# Patient Record
Sex: Male | Born: 1995 | Race: White | Hispanic: No | Marital: Single | Smoking: Current every day smoker
Health system: Southern US, Community
[De-identification: ages and names within clinical notes are randomized; demographics above are authoritative.]

---
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
## 2016-06-01 ENCOUNTER — Encounter: Payer: Self-pay | Admitting: Emergency Medicine

## 2016-06-01 ENCOUNTER — Emergency Department: Payer: Worker's Compensation

## 2016-06-01 ENCOUNTER — Emergency Department
Admission: EM | Admit: 2016-06-01 | Discharge: 2016-06-01 | Disposition: A | Discharge status: Home / Self Care | Payer: Worker's Compensation | Attending: Emergency Medicine | Admitting: Emergency Medicine

## 2016-06-01 DIAGNOSIS — Y92410 Unspecified street and highway as the place of occurrence of the external cause: Secondary | ICD-10-CM | POA: Diagnosis not present

## 2016-06-01 DIAGNOSIS — F17200 Nicotine dependence, unspecified, uncomplicated: Secondary | ICD-10-CM | POA: Diagnosis not present

## 2016-06-01 DIAGNOSIS — Y999 Unspecified external cause status: Secondary | ICD-10-CM | POA: Diagnosis not present

## 2016-06-01 DIAGNOSIS — M545 Low back pain: Secondary | ICD-10-CM | POA: Insufficient documentation

## 2016-06-01 DIAGNOSIS — S3992XA Unspecified injury of lower back, initial encounter: Secondary | ICD-10-CM | POA: Diagnosis present

## 2016-06-01 DIAGNOSIS — Y939 Activity, unspecified: Secondary | ICD-10-CM | POA: Diagnosis not present

## 2016-06-01 MED ORDER — CYCLOBENZAPRINE HCL 5 MG PO TABS: 5.0000 mg | ORAL_TABLET | Freq: Three times a day (TID) | ORAL | 0 refills | Status: AC | PRN

## 2016-06-01 MED ORDER — NAPROXEN 500 MG PO TABS: 500.0000 mg | ORAL_TABLET | Freq: Two times a day (BID) | ORAL | 0 refills | Status: AC
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
## 2020-05-09 ENCOUNTER — Other Ambulatory Visit: Payer: Self-pay | Admitting: Specialist

## 2020-05-09 DIAGNOSIS — M25512 Pain in left shoulder: Secondary | ICD-10-CM

## 2020-05-24 ENCOUNTER — Other Ambulatory Visit: Payer: Self-pay

## 2020-05-24 ENCOUNTER — Ambulatory Visit
Admission: RE | Admit: 2020-05-24 | Discharge: 2020-05-24 | Disposition: A | Discharge status: Home / Self Care | Payer: No Typology Code available for payment source | Source: Ambulatory Visit | Attending: Specialist | Admitting: Specialist

## 2020-05-24 DIAGNOSIS — M25512 Pain in left shoulder: Secondary | ICD-10-CM | POA: Insufficient documentation

## 2020-05-24 MED ORDER — SODIUM CHLORIDE (PF) 0.9 % IJ SOLN
10.0000 mL | INTRAMUSCULAR | Status: DC | PRN
  Administered 2020-05-24: 10 mL

## 2020-05-24 MED ORDER — IOHEXOL 180 MG/ML  SOLN
20.0000 mL | Freq: Once | INTRAMUSCULAR | Status: AC | PRN
  Administered 2020-05-24: 20 mL

## 2020-05-24 MED ORDER — LIDOCAINE HCL (PF) 1 % IJ SOLN
5.0000 mL | Freq: Once | INTRAMUSCULAR | Status: AC
  Administered 2020-05-24: 5 mL via INTRADERMAL
  Filled 2020-05-24: qty 5

## 2020-05-24 MED ORDER — GADOBUTROL 1 MMOL/ML IV SOLN
2.0000 mL | Freq: Once | INTRAVENOUS | Status: AC | PRN
  Administered 2020-05-24: 2 mL

## 2021-11-10 IMAGING — RF DG FLUORO GUIDE NDL PLC/BX
2 series · 5 of 5 positions shown · IV contrast (omnipaque)
Comparison: none

CLINICAL DATA: Left shoulder pain.

EXAM:
LEFT SHOULDER INJECTION UNDER FLUOROSCOPY
TECHNIQUE: After discussing the risks and benefits of this procedure the
patient informed consent was obtained. Following local anesthesia
with 1% lidocaine a 22 gauge needle was advanced into the left
shoulder joint and a standardized mixture of Omnipaque 180, sterile
saline, and gadolinium administered under fluoroscopic guidance. No
complications. Post arthrography instructions given to the patient.
FLUOROSCOPY TIME:  Fluoroscopy Time:  1 minutes 48 seconds
Radiation Exposure Index (if provided by the fluoroscopic device):
33.3 mGy

[Series 1: cp_standard · 0.17mm/px · 1 of 1 slices shown (1 of 2)]
[im 1/1]
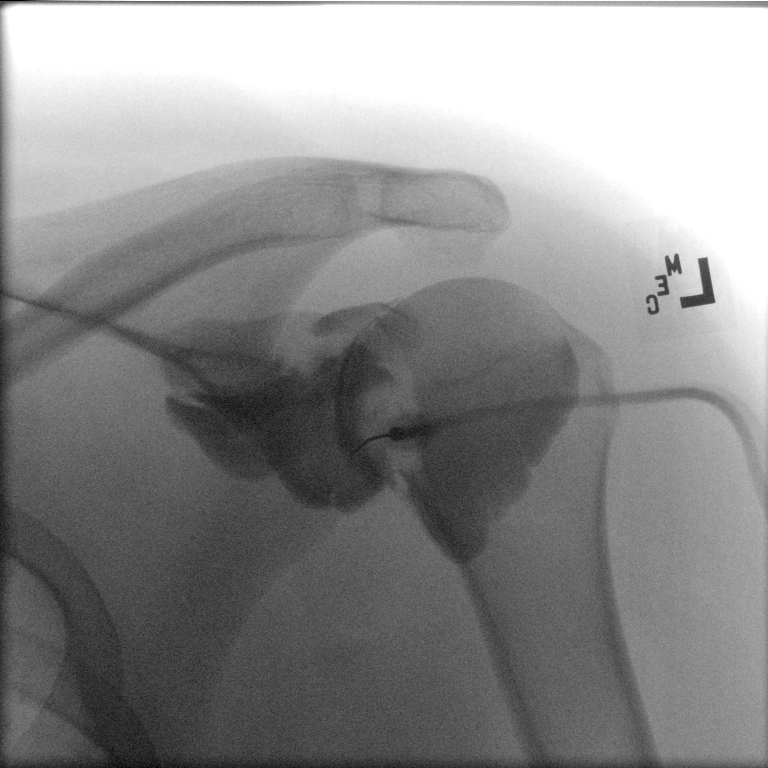

[Series 2: cp_standard · 0.17mm/px · 4 of 17 frames shown (2 of 2)]
[frame 1/17]
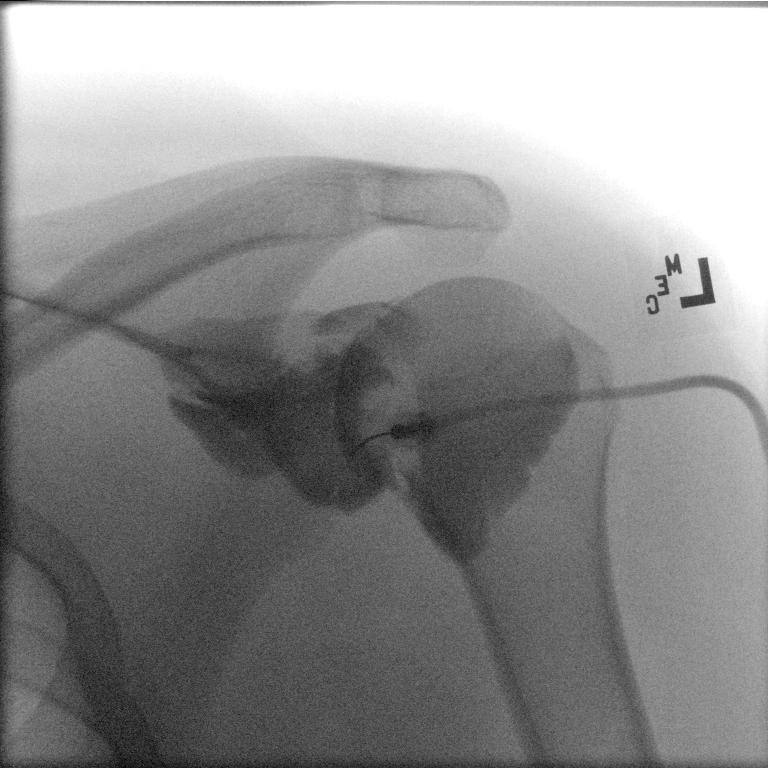
[frame 3/17]
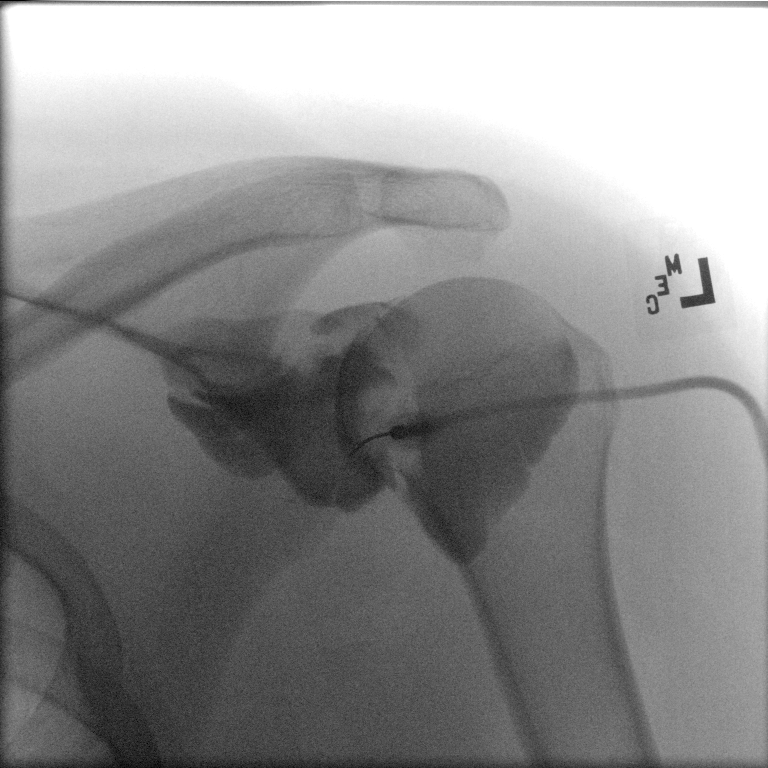
[frame 9/17]
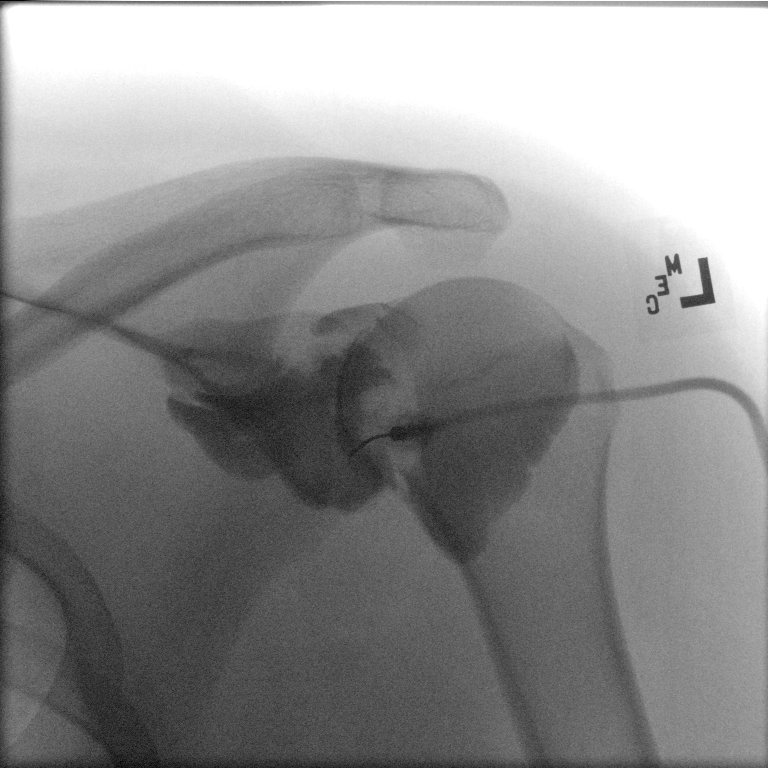
[frame 15/17]
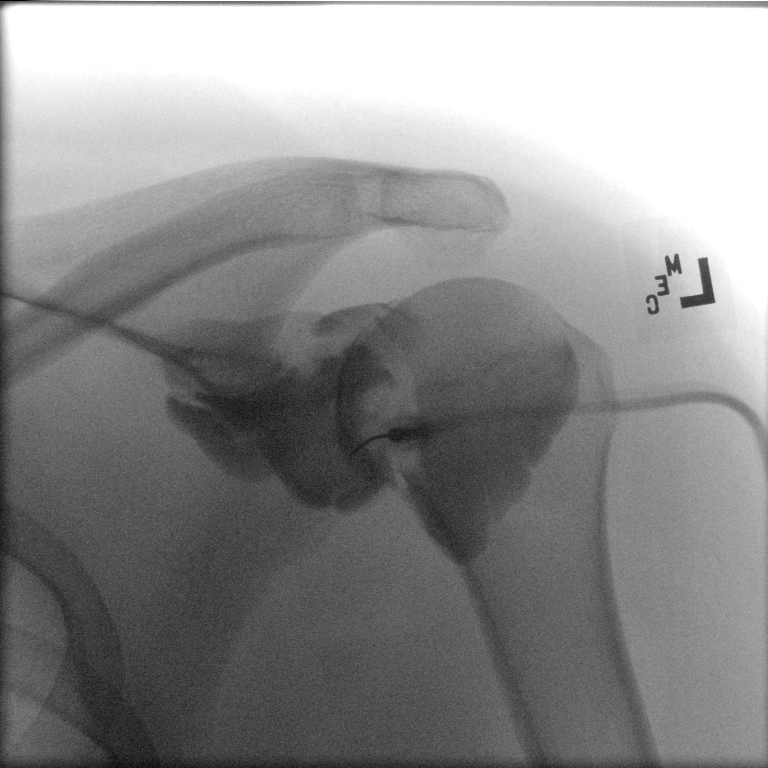

[5 of 5 positions shown; findings below may reference images not displayed]

FINDINGS: Successful left shoulder injection for MRI arthrography. No
complications.
IMPRESSION: Technically successful left shoulder injection for MRI.

## 2021-11-22 ENCOUNTER — Emergency Department (HOSPITAL_COMMUNITY): Payer: Managed Care, Other (non HMO)

## 2021-11-22 ENCOUNTER — Emergency Department (HOSPITAL_COMMUNITY)
Admission: EM | Admit: 2021-11-22 | Discharge: 2021-11-23 | Disposition: A | Discharge status: Home / Self Care | Payer: Managed Care, Other (non HMO) | Attending: Emergency Medicine | Admitting: Emergency Medicine

## 2021-11-22 ENCOUNTER — Other Ambulatory Visit: Payer: Self-pay

## 2021-11-22 DIAGNOSIS — N201 Calculus of ureter: Secondary | ICD-10-CM | POA: Diagnosis not present

## 2021-11-22 DIAGNOSIS — N23 Unspecified renal colic: Secondary | ICD-10-CM

## 2021-11-22 DIAGNOSIS — R1031 Right lower quadrant pain: Secondary | ICD-10-CM | POA: Diagnosis present

## 2021-11-22 LAB — CBC WITH DIFFERENTIAL/PLATELET
Abs Immature Granulocytes: 0.13 10*3/uL — ABNORMAL HIGH (ref 0.00–0.07)
Basophils Absolute: 0.1 10*3/uL (ref 0.0–0.1)
Basophils Relative: 1 %
Eosinophils Absolute: 0.2 10*3/uL (ref 0.0–0.5)
Eosinophils Relative: 1 %
HCT: 50.4 % (ref 39.0–52.0)
Hemoglobin: 18.2 g/dL — ABNORMAL HIGH (ref 13.0–17.0)
Immature Granulocytes: 1 %
Lymphocytes Relative: 26 %
Lymphs Abs: 5.3 10*3/uL — ABNORMAL HIGH (ref 0.7–4.0)
MCH: 29.6 pg (ref 26.0–34.0)
MCHC: 36.1 g/dL — ABNORMAL HIGH (ref 30.0–36.0)
MCV: 82.1 fL (ref 80.0–100.0)
Monocytes Absolute: 1.2 10*3/uL — ABNORMAL HIGH (ref 0.1–1.0)
Monocytes Relative: 6 %
Neutro Abs: 13.1 10*3/uL — ABNORMAL HIGH (ref 1.7–7.7)
Neutrophils Relative %: 65 %
Platelets: 345 10*3/uL (ref 150–400)
RBC: 6.14 MIL/uL — ABNORMAL HIGH (ref 4.22–5.81)
RDW: 12.1 % (ref 11.5–15.5)
WBC: 20 10*3/uL — ABNORMAL HIGH (ref 4.0–10.5)
nRBC: 0 % (ref 0.0–0.2)

## 2021-11-22 LAB — COMPREHENSIVE METABOLIC PANEL
ALT: 32 U/L (ref 0–44)
AST: 26 U/L (ref 15–41)
Albumin: 4.7 g/dL (ref 3.5–5.0)
Alkaline Phosphatase: 83 U/L (ref 38–126)
Anion gap: 18 — ABNORMAL HIGH (ref 5–15)
BUN: 14 mg/dL (ref 6–20)
CO2: 18 mmol/L — ABNORMAL LOW (ref 22–32)
Calcium: 10.2 mg/dL (ref 8.9–10.3)
Chloride: 102 mmol/L (ref 98–111)
Creatinine, Ser: 1.14 mg/dL (ref 0.61–1.24)
GFR, Estimated: >60 mL/min (ref >60)
Glucose, Bld: 131 mg/dL — ABNORMAL HIGH (ref 70–99)
Potassium: 3.6 mmol/L (ref 3.5–5.1)
Sodium: 138 mmol/L (ref 135–145)
Total Bilirubin: 0.8 mg/dL (ref 0.3–1.2)
Total Protein: 7.6 g/dL (ref 6.5–8.1)

## 2021-11-22 MED ORDER — KETOROLAC TROMETHAMINE 15 MG/ML IJ SOLN
15.0000 mg | Freq: Once | INTRAMUSCULAR | Status: AC
  Administered 2021-11-23: 15 mg via INTRAVENOUS
  Filled 2021-11-22: qty 1

## 2021-11-22 MED ORDER — FENTANYL CITRATE PF 50 MCG/ML IJ SOSY
75.0000 ug | PREFILLED_SYRINGE | Freq: Once | INTRAMUSCULAR | Status: AC
  Administered 2021-11-22: 75 ug via INTRAVENOUS
  Filled 2021-11-22: qty 2

## 2021-11-22 MED ORDER — HYDROMORPHONE HCL 1 MG/ML IJ SOLN
1.0000 mg | Freq: Once | INTRAMUSCULAR | Status: AC
  Administered 2021-11-23: 1 mg via INTRAVENOUS
  Filled 2021-11-22: qty 1

## 2021-11-22 MED ORDER — ONDANSETRON HCL 4 MG/2ML IJ SOLN
4.0000 mg | Freq: Once | INTRAMUSCULAR | Status: AC
  Administered 2021-11-22: 4 mg via INTRAVENOUS
  Filled 2021-11-22: qty 2

## 2021-11-22 MED ORDER — SODIUM CHLORIDE 0.9 % IV BOLUS
1000.0000 mL | Freq: Once | INTRAVENOUS | Status: AC
  Administered 2021-11-23: 1000 mL via INTRAVENOUS

## 2021-11-22 NOTE — ED Triage Notes (Addendum)
Severe R testicular pain that radiates into R flank, feels like cramping. Denies symptoms with urination. Endorses N/V. Denies fever.  No h/o abd surgeries. Last PO intake at 7pm.  No PMH or daily meds.

## 2021-11-22 NOTE — ED Provider Triage Note (Signed)
Emergency Medicine Provider Triage Evaluation Note  Ronald Hodge , a 26 y.o. male  was evaluated in triage.  Pt complains of sudden onset right testicular pain that radiates into his lower abdomen that started around 7:45 PM this evening.  N.p.o. since 7 PM.  Pain is 10 out of 10, associated nausea and vomiting with NBNB emesis.  No history of abdominal surgeries or similar pain in the past.  No urinary symptoms.  Review of Systems  Positive: As above Negative: Diarrhea fevers chills urinary symptoms  Physical Exam  BP (!) 157/67 (BP Location: Right Arm)   Pulse 80   Temp 98 F (36.7 C) (Oral)   Resp 16   Ht 6' (1.829 m)   Wt 129.3 kg   SpO2 100%   BMI 38.65 kg/m  Gen:   Awake, diaphoretic, visibly uncomfortable, tremulous secondary to pain Resp:  Normal effort  MSK:   Moves extremities without difficulty  Other:  GU exam performed in triage in the presence of RN Ronald Hodge.  Right testicle high riding in the scrotum relative to the left when standing.  Without erythema, induration or edema of the scrotum.  No palpable inguinal hernias.  Medical Decision Making  Medically screening exam initiated at 10:47 PM.  Appropriate orders placed.  Ronald Hodge was informed that the remainder of the evaluation will be completed by another provider, this initial triage assessment does not replace that evaluation, and the importance of remaining in the ED until their evaluation is complete.  Clinically quite concerning for right testicular torsion; ultrasound technician called and will present to triage to collect the patient, as he is at the 3-hour mark if this is torsion.  IV started in triage, IV analgesia and antiemetic administered.  Patient to be roomed next, charge RN aware.  This chart was dictated using voice recognition software, Dragon. Despite the best efforts of this provider to proofread and correct errors, errors may still occur which can change documentation  meaning.     Emeline Darling, PA-C 11/22/21 2255

## 2021-11-22 NOTE — ED Provider Notes (Signed)
[facility] EMERGENCY DEPARTMENT Provider Note   CSN: [id number] Arrival date & time: 11/22/21  2238     History {Add pertinent medical, surgical, social history, OB history to HPI:1} No chief complaint on file.   Ronald Hodge is a 26 y.o. male.  The history is provided by the patient.  Ronald Hodge is a 26 y.o. male who presents to the Emergency Department complaining of nominal pain.  He presents to the emergency department complaining of severe crampy abdominal pain in the right lower quadrant that radiates to the right back and right testicle.  He has associated vomiting due to pain.  No associated fever.  He does feel like he has to have a bowel movement but has not been able to have one.  No prior similar symptoms. He has not urinated since this started but he has not had recent dysuria or urinary symptoms.  No medical problems.  No allergies.       Home Medications Prior to Admission medications   Not on File      Allergies    Patient has no known allergies.    Review of Systems   Review of Systems  All other systems reviewed and are negative.   Physical Exam Updated Vital Signs BP (!) 157/67 (BP Location: Right Arm)   Pulse 80   Temp 98 F (36.7 C) (Oral)   Resp 16   Ht 6' (1.829 m)   Wt 129.3 kg   SpO2 100%   BMI 38.65 kg/m  Physical Exam Vitals and nursing note reviewed.  Constitutional:      General: He is in acute distress.     Appearance: He is well-developed.     Comments: Uncomfortable appearing  HENT:     Head: Normocephalic and atraumatic.  Cardiovascular:     Rate and Rhythm: Normal rate and regular rhythm.     Heart sounds: No murmur heard. Pulmonary:     Effort: Pulmonary effort is normal. No respiratory distress.     Breath sounds: Normal breath sounds.  Abdominal:     Palpations: Abdomen is soft.     Tenderness: There is no guarding or rebound.     Comments: Mild RLQ tenderness  Genitourinary:     Penis: Normal.      Testes: Normal.     Comments: Mild right testicular tenderness without edema or erythema Musculoskeletal:        General: No tenderness.  Skin:    General: Skin is warm and dry.  Neurological:     Mental Status: He is alert and oriented to person, place, and time.  Psychiatric:        Behavior: Behavior normal.     ED Results / Procedures / Treatments   Labs (all labs ordered are listed, but only abnormal results are displayed) Labs Reviewed  CBC WITH DIFFERENTIAL/PLATELET  COMPREHENSIVE METABOLIC PANEL  URINALYSIS, ROUTINE W REFLEX MICROSCOPIC    EKG None  Radiology No results found.  Procedures Procedures  {Document cardiac monitor, telemetry assessment procedure when appropriate:1}  Medications Ordered in ED Medications  ondansetron (ZOFRAN) injection 4 mg (4 mg Intravenous Given 11/22/21 2256)  fentaNYL (SUBLIMAZE) injection 75 mcg (75 mcg Intravenous Given 11/22/21 2256)    ED Course/ Medical Decision Making/ A&P                           Medical Decision Making Amount and/or Complexity of Data Reviewed  Radiology: ordered.  Risk Prescription drug management.   ***  {Document critical care time when appropriate:1} {Document review of labs and clinical decision tools ie heart score, Chads2Vasc2 etc:1}  {Document your independent review of radiology images, and any outside records:1} {Document your discussion with family members, caretakers, and with consultants:1} {Document social determinants of health affecting pt's care:1} {Document your decision making why or why not admission, treatments were needed:1} Final Clinical Impression(s) / ED Diagnoses Final diagnoses:  None    Rx / DC Orders ED Discharge Orders     None

## 2021-11-23 LAB — URINALYSIS, ROUTINE W REFLEX MICROSCOPIC
Bilirubin Urine: NEGATIVE
Glucose, UA: NEGATIVE mg/dL
Ketones, ur: 5 mg/dL — AB
Leukocytes,Ua: NEGATIVE
Nitrite: NEGATIVE
Protein, ur: 100 mg/dL — AB
RBC / HPF: >50 RBC/hpf — ABNORMAL HIGH (ref 0–5)
Specific Gravity, Urine: 1.026 (ref 1.005–1.030)
pH: 5 (ref 5.0–8.0)

## 2021-11-23 MED ORDER — OXYCODONE-ACETAMINOPHEN 5-325 MG PO TABS
1.0000 | ORAL_TABLET | Freq: Once | ORAL | Status: AC
  Administered 2021-11-23: 1 via ORAL
  Filled 2021-11-23: qty 1

## 2021-11-23 MED ORDER — OXYCODONE-ACETAMINOPHEN 5-325 MG PO TABS: 1.0000 | ORAL_TABLET | Freq: Four times a day (QID) | ORAL | 0 refills | Status: AC | PRN

## 2021-11-23 MED ORDER — TAMSULOSIN HCL 0.4 MG PO CAPS: 0.4000 mg | ORAL_CAPSULE | Freq: Every day | ORAL | 0 refills | Status: AC

## 2021-11-23 NOTE — Discharge Instructions (Addendum)
You have a 3 mm stone in your right ureter.  Drink plenty of fluids.  Please call the urology office to make a follow-up appointment.  Get rechecked immediately if you have fevers, uncontrolled pain or cannot urinate.

## 2021-11-24 LAB — URINE CULTURE: Culture: NO GROWTH

## 2023-05-05 ENCOUNTER — Other Ambulatory Visit: Payer: Self-pay

## 2023-05-05 ENCOUNTER — Emergency Department
Admission: EM | Admit: 2023-05-05 | Discharge: 2023-05-05 | Disposition: A | Discharge status: Home / Self Care | Attending: Emergency Medicine | Admitting: Emergency Medicine

## 2023-05-05 ENCOUNTER — Encounter: Payer: Self-pay | Admitting: Emergency Medicine

## 2023-05-05 DIAGNOSIS — W268XXA Contact with other sharp object(s), not elsewhere classified, initial encounter: Secondary | ICD-10-CM | POA: Insufficient documentation

## 2023-05-05 DIAGNOSIS — Y93G3 Activity, cooking and baking: Secondary | ICD-10-CM | POA: Diagnosis not present

## 2023-05-05 DIAGNOSIS — S61011A Laceration without foreign body of right thumb without damage to nail, initial encounter: Secondary | ICD-10-CM | POA: Diagnosis present

## 2023-05-05 DIAGNOSIS — Z23 Encounter for immunization: Secondary | ICD-10-CM | POA: Insufficient documentation

## 2023-05-05 MED ORDER — TETANUS-DIPHTH-ACELL PERTUSSIS 5-2.5-18.5 LF-MCG/0.5 IM SUSY
0.5000 mL | PREFILLED_SYRINGE | Freq: Once | INTRAMUSCULAR | Status: AC
  Administered 2023-05-05: 0.5 mL via INTRAMUSCULAR
  Filled 2023-05-05: qty 0.5

## 2023-05-05 MED ORDER — LIDOCAINE-EPINEPHRINE-TETRACAINE (LET) TOPICAL GEL
3.0000 mL | Freq: Once | TOPICAL | Status: AC
  Administered 2023-05-05: 3 mL via TOPICAL
  Filled 2023-05-05: qty 3

## 2023-05-05 NOTE — ED Triage Notes (Signed)
 Patient to ED via POV for thumb injury. Patient cut right thumb while cooking dinner. Patient has tourniquet on thumb- placed at 1805. Bleeding controlled.

## 2023-05-05 NOTE — Discharge Instructions (Addendum)
 Keep the wound clean, dry, and covered.  Avoid any lotions, creams, oils, ointments on the wound.  The Dermabond patch should break away and peel off and 7 to 10 days.

## 2023-05-05 NOTE — ED Provider Notes (Signed)
 [facility] Emergency Department Provider Note     Event Date/Time   First MD Initiated Contact with Patient 05/05/23 1931     (approximate)   History   Finger Injury   HPI  Ronald Hodge is a 28 y.o. male with a noncontributory medical history, presents to the ED for evaluation of accidental right thumb injury.  Patient presents with a chief laceration to the right thumb while using a mandolin slicer.  He denies any other injury at this time.  The distal portion of his fat pad is missing with a small distal edge of the nail as well.  Minimal bleeding at place.  Patient with a improvised tourniquet in place.  No other injury ported at this time.  Patient reports an unknown tetanus status.  Physical Exam   Triage Vital Signs: ED Triage Vitals [05/05/23 1835]  Encounter Vitals Group     BP (!) 141/66     Systolic BP Percentile      Diastolic BP Percentile      Pulse Rate (!) 102     Resp 18     Temp 98.1 F (36.7 C)     Temp Source Oral     SpO2 96 %     Weight 285 lb (129.3 kg)     Height 6' (1.829 m)     Head Circumference      Peak Flow      Pain Score 4     Pain Loc      Pain Education      Exclude from Growth Chart     Most recent vital signs: Vitals:   05/05/23 1835 05/05/23 2031  BP: (!) 141/66 (!) 140/78  Pulse: (!) 102 98  Resp: 18 16  Temp: 98.1 F (36.7 C) 98.1 F (36.7 C)  SpO2: 96% 98%    General Awake, no distress.  HEENT NCAT. PERRL. EOMI. No rhinorrhea. Mucous membranes are moist.  CV:  Good peripheral perfusion. RRR RESP:  Normal effort.  ABD:  No distention.  MSK:  Right hand with a distal fat pad shave laceration to the right thumb.  Normal composite fist.  Minimal bleeding noted.  Patient with a improvised tourniquet in place (T-shirt material tied around the base of the thumb) NEURO: Cranial nerves II to XII grossly intact.  Normal gross sensation.  ED Results / Procedures / Treatments   Labs (all  labs ordered are listed, but only abnormal results are displayed) Labs Reviewed - No data to display   EKG    RADIOLOGY  No results found.   PROCEDURES:  Critical Care performed: No  .Laceration Repair  Date/Time: 05/05/2023 7:42 PM  Performed by: Lissa Hoard, PA-C Authorized by: Lissa Hoard, PA-C   Consent:    Consent obtained:  Verbal   Consent given by:  Patient   Risks, benefits, and alternatives were discussed: yes     Risks discussed:  Pain and poor wound healing   Alternatives discussed:  No treatment Universal protocol:    Site/side marked: yes     Patient identity confirmed:  Verbally with patient Anesthesia:    Anesthesia method:  Topical application   Topical anesthetic:  LET Laceration details:    Location:  Finger   Finger location:  R thumb   Length (cm):  1   Depth (mm):  4 Exploration:    Limited defect created (wound extended): no     Hemostasis achieved  with:  LET and tourniquet   Contaminated: no   Treatment:    Area cleansed with:  Saline   Amount of cleaning:  Standard   Irrigation solution:  Sterile saline   Debridement:  None   Undermining:  None   Scar revision: no   Skin repair:    Repair method:  Tissue adhesive Approximation:    Approximation:  Close Repair type:    Repair type:  Simple Post-procedure details:    Dressing:  Non-adherent dressing   Procedure completion:  Tolerated well, no immediate complications Comments:     Distal shave laceration of the thumb fat pad, treated with a Dermabond glue patch after adequate anesthesia was achieved.  A tourniquet was placed and the finger was exsanguinated to for bloodless wound bed.    MEDICATIONS ORDERED IN ED: Medications  lidocaine-EPINEPHrine-tetracaine (LET) topical gel (3 mLs Topical Given 05/05/23 1939)  Tdap (BOOSTRIX) injection 0.5 mL (0.5 mLs Intramuscular Given 05/05/23 1947)     IMPRESSION / MDM / ASSESSMENT AND PLAN / ED COURSE  I reviewed  the triage vital signs and the nursing notes.                              Differential diagnosis includes, but is not limited to, laceration, abrasion, hematoma, contusion, sprain  Patient's presentation is most consistent with acute, uncomplicated illness.  Patient's diagnosis is consistent with thumb shave laceration.  Patient sliced the very distal portion of his fingertip with a mandolin slicer at home.  Patient will be discharged home with wound care instructions and supplies. Patient is to follow up with his PCP or local urgent care as discussed, as needed or otherwise directed. Patient is given ED precautions to return to the ED for any worsening or new symptoms.   FINAL CLINICAL IMPRESSION(S) / ED DIAGNOSES   Final diagnoses:  Thumb laceration, right, initial encounter     Rx / DC Orders   ED Discharge Orders     None        Note:  This document was prepared using Dragon voice recognition software and may include unintentional dictation errors.    Lissa Hoard, PA-C 05/06/23 1944    Trinna Post, MD 05/06/23 [phone]

## 2024-03-03 ENCOUNTER — Other Ambulatory Visit: Payer: Self-pay

## 2024-03-03 ENCOUNTER — Encounter: Payer: Self-pay | Admitting: Emergency Medicine

## 2024-03-03 ENCOUNTER — Emergency Department

## 2024-03-03 ENCOUNTER — Emergency Department
Admission: EM | Admit: 2024-03-03 | Discharge: 2024-03-03 | Disposition: A | Discharge status: Home / Self Care | Attending: Emergency Medicine | Admitting: Emergency Medicine

## 2024-03-03 DIAGNOSIS — N132 Hydronephrosis with renal and ureteral calculous obstruction: Secondary | ICD-10-CM | POA: Insufficient documentation

## 2024-03-03 DIAGNOSIS — R1031 Right lower quadrant pain: Secondary | ICD-10-CM | POA: Diagnosis present

## 2024-03-03 DIAGNOSIS — N23 Unspecified renal colic: Secondary | ICD-10-CM

## 2024-03-03 LAB — COMPREHENSIVE METABOLIC PANEL WITH GFR
ALT: 35 U/L (ref 0–44)
AST: 26 U/L (ref 15–41)
Albumin: 4.8 g/dL (ref 3.5–5.0)
Alkaline Phosphatase: 84 U/L (ref 38–126)
Anion gap: 14 (ref 5–15)
BUN: 23 mg/dL — ABNORMAL HIGH (ref 6–20)
CO2: 20 mmol/L — ABNORMAL LOW (ref 22–32)
Calcium: 10.3 mg/dL (ref 8.9–10.3)
Chloride: 103 mmol/L (ref 98–111)
Creatinine, Ser: 0.96 mg/dL (ref 0.61–1.24)
GFR, Estimated: >60 mL/min
Glucose, Bld: 125 mg/dL — ABNORMAL HIGH (ref 70–99)
Potassium: 4.1 mmol/L (ref 3.5–5.1)
Sodium: 138 mmol/L (ref 135–145)
Total Bilirubin: 0.8 mg/dL (ref 0.0–1.2)
Total Protein: 7.6 g/dL (ref 6.5–8.1)

## 2024-03-03 LAB — URINALYSIS, ROUTINE W REFLEX MICROSCOPIC
Bacteria, UA: NONE SEEN
Bilirubin Urine: NEGATIVE
Glucose, UA: NEGATIVE mg/dL
Ketones, ur: 5 mg/dL — AB
Leukocytes,Ua: NEGATIVE
Nitrite: NEGATIVE
Protein, ur: 30 mg/dL — AB
Specific Gravity, Urine: 1.025 (ref 1.005–1.030)
pH: 6 (ref 5.0–8.0)

## 2024-03-03 LAB — CBC
HCT: 48.1 % (ref 39.0–52.0)
Hemoglobin: 16.7 g/dL (ref 13.0–17.0)
MCH: 28.8 pg (ref 26.0–34.0)
MCHC: 34.7 g/dL (ref 30.0–36.0)
MCV: 83.1 fL (ref 80.0–100.0)
Platelets: 317 K/uL (ref 150–400)
RBC: 5.79 MIL/uL (ref 4.22–5.81)
RDW: 12 % (ref 11.5–15.5)
WBC: 14.8 K/uL — ABNORMAL HIGH (ref 4.0–10.5)
nRBC: 0 % (ref 0.0–0.2)

## 2024-03-03 LAB — LIPASE, BLOOD: Lipase: 23 U/L (ref 11–51)

## 2024-03-03 MED ORDER — NAPROXEN 500 MG PO TABS: 500.0000 mg | ORAL_TABLET | Freq: Two times a day (BID) | ORAL | 0 refills | Status: AC

## 2024-03-03 MED ORDER — KETOROLAC TROMETHAMINE 15 MG/ML IJ SOLN
15.0000 mg | Freq: Once | INTRAMUSCULAR | Status: AC
  Administered 2024-03-03: 15 mg via INTRAVENOUS
  Filled 2024-03-03: qty 1

## 2024-03-03 MED ORDER — ONDANSETRON 4 MG PO TBDP: 4.0000 mg | ORAL_TABLET | Freq: Three times a day (TID) | ORAL | 0 refills | Status: AC | PRN

## 2024-03-03 MED ORDER — TAMSULOSIN HCL 0.4 MG PO CAPS: 0.4000 mg | ORAL_CAPSULE | Freq: Every day | ORAL | 0 refills | Status: AC

## 2024-03-03 MED ORDER — IOHEXOL 300 MG/ML  SOLN
100.0000 mL | Freq: Once | INTRAMUSCULAR | Status: AC | PRN
  Administered 2024-03-03: 100 mL via INTRAVENOUS

## 2024-03-03 NOTE — ED Triage Notes (Signed)
 Pt reports he was on way to work today when he began having abd pain, testicular pain and vomiting. Reports hx of kidney stones and feels similar but also states his right testicle feels like vice grip.

## 2024-03-03 NOTE — ED Provider Notes (Signed)
 "  [facility] Provider Note    Event Date/Time   First MD Initiated Contact with Patient 03/03/24 1137     (approximate)   History   Testicle Pain   HPI  Ronald Hodge is a 29 y.o. male with a past medical history of nephrolithiasis who presents today for evaluation of right testicular pain.  Patient reports that around 830 when he was driving to work his pain began.  He reports that he feels nauseated and also has pain in his right lower quadrant, but no fevers or chills.  There are no active problems to display for this patient.         Physical Exam   Triage Vital Signs: ED Triage Vitals [03/03/24 1106]  Encounter Vitals Group     BP (!) 154/76     Girls Systolic BP Percentile      Girls Diastolic BP Percentile      Boys Systolic BP Percentile      Boys Diastolic BP Percentile      Pulse Rate 88     Resp 18     Temp 97.6 F (36.4 C)     Temp Source Oral     SpO2 100 %     Weight 285 lb (129.3 kg)     Height 6' (1.829 m)     Head Circumference      Peak Flow      Pain Score 8     Pain Loc      Pain Education      Exclude from Growth Chart     Most recent vital signs: Vitals:   03/03/24 1106 03/03/24 1527  BP: (!) 154/76 111/65  Pulse: 88 63  Resp: 18 18  Temp: 97.6 F (36.4 C) 98.4 F (36.9 C)  SpO2: 100% 100%    Physical Exam Vitals and nursing note reviewed.  Constitutional:      General: Awake and alert. No acute distress.    Appearance: Normal appearance. The patient is normal weight.  HENT:     Head: Normocephalic and atraumatic.     Mouth: Mucous membranes are moist.  Eyes:     General: PERRL. Normal EOMs        Right eye: No discharge.        Left eye: No discharge.     Conjunctiva/sclera: Conjunctivae normal.  Cardiovascular:     Rate and Rhythm: Normal rate and regular rhythm.     Pulses: Normal pulses.  Pulmonary:     Effort: Pulmonary effort is normal. No respiratory distress.     Breath  sounds: Normal breath sounds.  Abdominal:     Abdomen is soft. There is no abdominal tenderness. No rebound or guarding. No distention. GU exam with [employer] PA student: Normal cremasteric reflexes bilaterally.  No testicular abnormalities noted.  Musculoskeletal:        General: No swelling. Normal range of motion.     Cervical back: Normal range of motion and neck supple.  Skin:    General: Skin is warm and dry.     Capillary Refill: Capillary refill takes less than 2 seconds.     Findings: No rash.  Neurological:     Mental Status: The patient is awake and alert.      ED Results / Procedures / Treatments   Labs (all labs ordered are listed, but only abnormal results are displayed) Labs Reviewed  COMPREHENSIVE METABOLIC PANEL WITH GFR - Abnormal; Notable for the  following components:      Result Value   CO2 20 (*)    Glucose, Bld 125 (*)    BUN 23 (*)    All other components within normal limits  CBC - Abnormal; Notable for the following components:   WBC 14.8 (*)    All other components within normal limits  URINALYSIS, ROUTINE W REFLEX MICROSCOPIC - Abnormal; Notable for the following components:   Color, Urine YELLOW (*)    APPearance HAZY (*)    Hgb urine dipstick MODERATE (*)    Ketones, ur 5 (*)    Protein, ur 30 (*)    All other components within normal limits  LIPASE, BLOOD     EKG     RADIOLOGY I independently reviewed and interpreted imaging and agree with radiologists findings.     PROCEDURES:  Critical Care performed:   Procedures   MEDICATIONS ORDERED IN ED: Medications  ketorolac  (TORADOL ) 15 MG/ML injection 15 mg (15 mg Intravenous Given 03/03/24 1358)  iohexol  (OMNIPAQUE ) 300 MG/ML solution 100 mL (100 mLs Intravenous Contrast Given 03/03/24 1421)     IMPRESSION / MDM / ASSESSMENT AND PLAN / ED COURSE  I reviewed the triage vital signs and the nursing notes.   Differential diagnosis includes, but is not limited to, testicular torsion,  epididymitis, varicocele, nephrolithiasis/ureteral colic.  Patient is awake and alert, hemodynamically stable and afebrile.  Physical exam with normal-appearing testicles bilaterally, normal testicular lie, normal cremasteric reflex.  No skin changes, no swelling, no tenderness to palpation.  Further workup is indicated.  Labs obtained in triage are revealing for leukocytosis no leukocytes or nitrites to suggest infection in his urine.  He was treated symptomatically with Toradol .  Scrotal ultrasound obtained and is normal.  CT AP obtained and reveals a mild right sided hydronephrosis due to an obstructive calculus in the proximal right ureter measuring 4 x 3 x 4 mm.  This is within the passable limits.  He has a normal creatinine.  He does have a leukocytosis, however his urine does not appear to be infected.  He is resting comfortably on the stretcher.  We discussed return precautions and outpatient follow-up.  Symptomatic medications were sent to his pharmacy.  He was given the appropriate follow-up information for urology.  He understands return precautions in the meantime.  Patient understands and agrees with plan.  Discharged in stable condition.  Patient's presentation is most consistent with acute complicated illness / injury requiring diagnostic workup.   FINAL CLINICAL IMPRESSION(S) / ED DIAGNOSES   Final diagnoses:  Ureteral colic  Hydronephrosis with urinary obstruction due to ureteral calculus     Rx / DC Orders   ED Discharge Orders          Ordered    naproxen  (NAPROSYN ) 500 MG tablet  2 times daily with meals        03/03/24 1541    ondansetron  (ZOFRAN -ODT) 4 MG disintegrating tablet  Every 8 hours PRN        03/03/24 1541    tamsulosin  (FLOMAX ) 0.4 MG CAPS capsule  Daily        03/03/24 1541             Note:  This document was prepared using Dragon voice recognition software and may include unintentional dictation errors.   Zyann Mabry E, PA-C 03/03/24  1900  "

## 2024-03-03 NOTE — Discharge Instructions (Addendum)
 You are found to have a 4 mm kidney stone which is the cause of your pain today.  You may continue to take the medications as prescribed to help with your symptoms.  Please return for any new, worsening, or changing symptoms or other concerns.  It was a pleasure caring for you today

## 2024-03-03 NOTE — ED Notes (Signed)
 Pt in bathroom when called for triage. Family up to desk reporting pt vomiting in bathroom and needs to go back immediately. RN explaining pt will be triaged once he is done vomiting.
